# Patient Record
Sex: Male | Born: 1975 | Hispanic: Yes | Marital: Married | Smoking: Former smoker
Health system: Southern US, Community
[De-identification: ages and names within clinical notes are randomized; demographics above are authoritative.]

---

## 2020-03-05 ENCOUNTER — Other Ambulatory Visit: Payer: Self-pay

## 2020-03-05 ENCOUNTER — Encounter: Payer: Self-pay | Admitting: Emergency Medicine

## 2020-03-05 ENCOUNTER — Emergency Department: Payer: BLUE CROSS/BLUE SHIELD

## 2020-03-05 ENCOUNTER — Inpatient Hospital Stay
Admission: EM | Admit: 2020-03-05 | Discharge: 2020-03-07 | DRG: 177 | Disposition: A | Discharge status: Home / Self Care | Payer: BLUE CROSS/BLUE SHIELD | Attending: Family Medicine | Admitting: Family Medicine

## 2020-03-05 DIAGNOSIS — R7989 Other specified abnormal findings of blood chemistry: Secondary | ICD-10-CM | POA: Diagnosis present

## 2020-03-05 DIAGNOSIS — U071 COVID-19: Secondary | ICD-10-CM | POA: Diagnosis present

## 2020-03-05 DIAGNOSIS — J9601 Acute respiratory failure with hypoxia: Secondary | ICD-10-CM | POA: Diagnosis present

## 2020-03-05 DIAGNOSIS — J1282 Pneumonia due to coronavirus disease 2019: Secondary | ICD-10-CM | POA: Diagnosis present

## 2020-03-05 DIAGNOSIS — Z9981 Dependence on supplemental oxygen: Secondary | ICD-10-CM

## 2020-03-05 DIAGNOSIS — E669 Obesity, unspecified: Secondary | ICD-10-CM | POA: Diagnosis present

## 2020-03-05 DIAGNOSIS — Z6837 Body mass index (BMI) 37.0-37.9, adult: Secondary | ICD-10-CM

## 2020-03-05 DIAGNOSIS — R7982 Elevated C-reactive protein (CRP): Secondary | ICD-10-CM | POA: Diagnosis present

## 2020-03-05 DIAGNOSIS — E876 Hypokalemia: Secondary | ICD-10-CM | POA: Diagnosis present

## 2020-03-05 DIAGNOSIS — R7303 Prediabetes: Secondary | ICD-10-CM | POA: Diagnosis present

## 2020-03-05 DIAGNOSIS — Z87891 Personal history of nicotine dependence: Secondary | ICD-10-CM

## 2020-03-05 LAB — COMPREHENSIVE METABOLIC PANEL
ALT: 48 U/L — ABNORMAL HIGH (ref 0–44)
AST: 46 U/L — ABNORMAL HIGH (ref 15–41)
Albumin: 3.3 g/dL — ABNORMAL LOW (ref 3.5–5.0)
Alkaline Phosphatase: 41 U/L (ref 38–126)
Anion gap: 11 (ref 5–15)
BUN: 8 mg/dL (ref 6–20)
CO2: 26 mmol/L (ref 22–32)
Calcium: 8.7 mg/dL — ABNORMAL LOW (ref 8.9–10.3)
Chloride: 97 mmol/L — ABNORMAL LOW (ref 98–111)
Creatinine, Ser: 0.8 mg/dL (ref 0.61–1.24)
GFR calc Af Amer: >60 mL/min (ref >60)
GFR calc non Af Amer: >60 mL/min (ref >60)
Glucose, Bld: 118 mg/dL — ABNORMAL HIGH (ref 70–99)
Potassium: 3.2 mmol/L — ABNORMAL LOW (ref 3.5–5.1)
Sodium: 134 mmol/L — ABNORMAL LOW (ref 135–145)
Total Bilirubin: 0.9 mg/dL (ref 0.3–1.2)
Total Protein: 8.4 g/dL — ABNORMAL HIGH (ref 6.5–8.1)

## 2020-03-05 LAB — CBC WITH DIFFERENTIAL/PLATELET
Abs Immature Granulocytes: 0.06 10*3/uL (ref 0.00–0.07)
Basophils Absolute: 0 10*3/uL (ref 0.0–0.1)
Basophils Relative: 0 %
Eosinophils Absolute: 0.2 10*3/uL (ref 0.0–0.5)
Eosinophils Relative: 3 %
HCT: 39.9 % (ref 39.0–52.0)
Hemoglobin: 12.1 g/dL — ABNORMAL LOW (ref 13.0–17.0)
Immature Granulocytes: 1 %
Lymphocytes Relative: 20 %
Lymphs Abs: 1.5 10*3/uL (ref 0.7–4.0)
MCH: 25.2 pg — ABNORMAL LOW (ref 26.0–34.0)
MCHC: 30.3 g/dL (ref 30.0–36.0)
MCV: 83 fL (ref 80.0–100.0)
Monocytes Absolute: 0.3 10*3/uL (ref 0.1–1.0)
Monocytes Relative: 4 %
Neutro Abs: 5.4 10*3/uL (ref 1.7–7.7)
Neutrophils Relative %: 72 %
Platelets: 325 10*3/uL (ref 150–400)
RBC: 4.81 MIL/uL (ref 4.22–5.81)
RDW: 13.4 % (ref 11.5–15.5)
WBC Morphology: >10
WBC: 7.4 10*3/uL (ref 4.0–10.5)
nRBC: 0 % (ref 0.0–0.2)

## 2020-03-05 LAB — PROCALCITONIN: Procalcitonin: <0.1 ng/mL

## 2020-03-05 LAB — LACTIC ACID, PLASMA: Lactic Acid, Venous: 1.8 mmol/L (ref 0.5–1.9)

## 2020-03-05 LAB — TROPONIN I (HIGH SENSITIVITY): Troponin I (High Sensitivity): 6 ng/L (ref <18)

## 2020-03-05 LAB — BRAIN NATRIURETIC PEPTIDE: B Natriuretic Peptide: 25 pg/mL (ref 0.0–100.0)

## 2020-03-05 LAB — FERRITIN: Ferritin: 486 ng/mL — ABNORMAL HIGH (ref 24–336)

## 2020-03-05 LAB — FIBRINOGEN: Fibrinogen: 651 mg/dL — ABNORMAL HIGH (ref 210–475)

## 2020-03-05 LAB — C-REACTIVE PROTEIN: CRP: 7.3 mg/dL — ABNORMAL HIGH (ref <1.0)

## 2020-03-05 LAB — TRIGLYCERIDES: Triglycerides: 122 mg/dL (ref <150)

## 2020-03-05 LAB — FIBRIN DERIVATIVES D-DIMER (ARMC ONLY): Fibrin derivatives D-dimer (ARMC): 952.53 ng/mL (FEU) — ABNORMAL HIGH (ref 0.00–499.00)

## 2020-03-05 MED ORDER — DEXAMETHASONE SODIUM PHOSPHATE 10 MG/ML IJ SOLN
6.0000 mg | Freq: Once | INTRAMUSCULAR | Status: AC
  Administered 2020-03-05: 6 mg via INTRAVENOUS
  Filled 2020-03-05: qty 1

## 2020-03-05 MED ORDER — ACETAMINOPHEN 325 MG PO TABS: 650.0000 mg | ORAL_TABLET | Freq: Four times a day (QID) | ORAL | Status: DC | PRN

## 2020-03-05 MED ORDER — ENOXAPARIN SODIUM 60 MG/0.6ML ~~LOC~~ SOLN
60.0000 mg | Freq: Every day | SUBCUTANEOUS | Status: DC
  Filled 2020-03-05 (×3): qty 0.6

## 2020-03-05 MED ORDER — POTASSIUM CHLORIDE CRYS ER 20 MEQ PO TBCR
40.0000 meq | EXTENDED_RELEASE_TABLET | Freq: Once | ORAL | Status: AC
  Administered 2020-03-05: 40 meq via ORAL
  Filled 2020-03-05: qty 2

## 2020-03-05 MED ORDER — GUAIFENESIN-DM 100-10 MG/5ML PO SYRP: 10.0000 mL | ORAL_SOLUTION | ORAL | Status: DC | PRN

## 2020-03-05 MED ORDER — ONDANSETRON HCL 4 MG PO TABS: 4.0000 mg | ORAL_TABLET | Freq: Four times a day (QID) | ORAL | Status: DC | PRN

## 2020-03-05 MED ORDER — ONDANSETRON HCL 4 MG/2ML IJ SOLN: 4.0000 mg | Freq: Four times a day (QID) | INTRAMUSCULAR | Status: DC | PRN

## 2020-03-05 MED ORDER — ASCORBIC ACID 500 MG PO TABS
500.0000 mg | ORAL_TABLET | Freq: Every day | ORAL | Status: DC
  Administered 2020-03-06 – 2020-03-07 (×2): 500 mg via ORAL
  Filled 2020-03-05 (×2): qty 1

## 2020-03-05 MED ORDER — ZINC SULFATE 220 (50 ZN) MG PO CAPS
220.0000 mg | ORAL_CAPSULE | Freq: Every day | ORAL | Status: DC
  Administered 2020-03-06 – 2020-03-07 (×2): 220 mg via ORAL
  Filled 2020-03-05 (×2): qty 1

## 2020-03-05 MED ORDER — HYDROCOD POLST-CPM POLST ER 10-8 MG/5ML PO SUER: 5.0000 mL | Freq: Two times a day (BID) | ORAL | Status: DC | PRN

## 2020-03-05 MED ORDER — SODIUM CHLORIDE 0.9 % IV SOLN
100.0000 mg | Freq: Every day | INTRAVENOUS | Status: DC
  Administered 2020-03-06 – 2020-03-07 (×2): 100 mg via INTRAVENOUS
  Filled 2020-03-05: qty 20
  Filled 2020-03-05: qty 100

## 2020-03-05 MED ORDER — SODIUM CHLORIDE 0.9 % IV SOLN
200.0000 mg | Freq: Once | INTRAVENOUS | Status: AC
  Administered 2020-03-05: 200 mg via INTRAVENOUS
  Filled 2020-03-05: qty 40

## 2020-03-05 MED ORDER — SODIUM CHLORIDE 0.9% FLUSH
3.0000 mL | Freq: Two times a day (BID) | INTRAVENOUS | Status: DC
  Administered 2020-03-05 – 2020-03-07 (×4): 3 mL via INTRAVENOUS

## 2020-03-05 MED ORDER — DEXAMETHASONE 4 MG PO TABS
6.0000 mg | ORAL_TABLET | Freq: Every day | ORAL | Status: DC
  Administered 2020-03-06: 6 mg via ORAL
  Filled 2020-03-05: qty 2

## 2020-03-05 NOTE — ED Triage Notes (Signed)
FIRST NURSE NOTE- brought from [facility] for [facility] r/t covid.  sats 93 at [facility] on room air.

## 2020-03-05 NOTE — ED Notes (Signed)
Requested copy of pt's covid test results from [facility] at [facility] urgent care

## 2020-03-05 NOTE — ED Notes (Signed)
Pt placed on 2L [place] due to O2 at 90% on RA

## 2020-03-05 NOTE — ED Notes (Signed)
Received copy of pt's positive test result from [facility] urgent care

## 2020-03-05 NOTE — ED Notes (Signed)
Transport here to take patient to OR for procedure

## 2020-03-05 NOTE — ED Notes (Signed)
Pt speaking with this RN in NAD, speaking in complete sentences without SHOB.

## 2020-03-05 NOTE — ED Notes (Signed)
Sandwich tray and snacks provided per pt request

## 2020-03-05 NOTE — Consult Note (Signed)
Remdesivir - Pharmacy Brief Note   O:  ALT: pending CXR: suspicious for atypical organism pneumonia SpO2: 93% on room air   A/P:  Per chart, patient tested positive for SARS-CoV-2 on 3/22   Remdesivir 200 mg IVPB once followed by 100 mg IVPB daily x 4 days.   Dorothea Ogle Pharmacy Resident 03/05/2020 2:37 PM

## 2020-03-05 NOTE — H&P (Signed)
History and Physical   Lyncoln Ledgerwood BMW:[id number] DOB: March 04, 1976 DOA: 03/05/2020  Referring MD/NP/PA: Dr. Cherylann Banas, [place] PCP: None [Health Department] Outpatient Specialists: None Patient coming from: Home  Chief Complaint: Covid, cough, dyspnea  HPI: Alexander Newman is a 44 y.o. male with no medical follow up with no known chronic medical conditions who presented to [facility] ED 4/2 due to cough and shortness of breath having tested positive for covid-19 on 3/23. Symptoms began gradually with nonproductive cough with mild associated exertional dyspnea on 3/20. This stayed approximately stable, constant for the next week up until the previous couple days.when dyspnea has worsened, present at rest. He's had mild fatigue but no other significant symptoms. His wife also has a cough and tested positive for covid.    ED Course: Temp 33F, RR 39, 90% at rest on room air with significant shortness of breath. WBC, lactic acid, troponin, BNP, and PCT all within normal limits. ALT 48. D-dimer 952 (ULN 500). CXR demonstrates significant bilateral patchy infiltrates.   Review of Systems: Denies fever, chills, weight loss, changes in vision or hearing, headache, sore throat, chest pain, palpitations, abdominal pain, nausea, vomiting, changes in bowel habits, blood in stool, change in bladder habits, myalgias, arthralgias, and rash, and per HPI. All others reviewed and are negative.   PMH: History reviewed. No pertinent past medical history.  PSH: History reviewed. No pertinent surgical history. SH: Full time truck driver, married, lives with wife. He reports that he has quit smoking. He has never used smokeless tobacco. No history on file for alcohol and drug.  Meds: None Allergies: No Known Allergies FH: No FH or personal hx DVT.   Physical Exam: Vitals:   03/05/20 1341 03/05/20 1600 03/05/20 1700 03/05/20 1730  BP:  116/64 (!) 118/57 114/68  Pulse:  82 81 84  Resp:  (!) 39 (!) 32 (!) 25  Temp:       SpO2:  91% 98% 93%  Weight: 127 kg     Height: 6' (1.829 m)      Constitutional: 44 y.o. male in no distress, calm demeanor Eyes: Lids and conjunctivae normal, PERRL ENMT: Mucous membranes are moist. Posterior pharynx clear of any exudate or lesions. Normal dentition.  Neck: Supple, no masses, no thyromegaly Respiratory: Tachypneic 30-40's without accessory muscle use. Crackles diffusely bilaterally Cardiovascular: Regular borderline tachycardia, no murmurs, rubs, or gallops. No carotid bruits. No JVD. No LE edema. Palpable pedal pulses. Abdomen: Normoactive bowel sounds. No tenderness, non-distended, and no masses palpated. No hepatosplenomegaly. GU: No indwelling catheter Musculoskeletal: No clubbing / cyanosis. No joint deformity upper and lower extremities. Good ROM, no contractures. Normal muscle tone.  Skin: Warm, dry. No rashes, wounds, or ulcers. No significant lesions noted.  Neurologic: CN II-XII grossly intact. Speech normal. No focal deficits in motor strength or sensation in all extremities.  Psychiatric: Alert and oriented x3. Normal judgment and insight. Mood euthymic with congruent affect.   Labs on Admission: I have personally reviewed following labs and imaging studies  CBC: Recent Labs  Lab 03/05/20 1459  WBC 7.4  NEUTROABS 5.4  HGB 12.1*  HCT 39.9  MCV 83.0  PLT 272   Basic Metabolic Panel: Recent Labs  Lab 03/05/20 1459  NA 134*  K 3.2*  CL 97*  CO2 26  GLUCOSE 118*  BUN 8  CREATININE 0.80  CALCIUM 8.7*   Liver Function Tests: Recent Labs  Lab 03/05/20 1459  AST 46*  ALT 48*  ALKPHOS 41  BILITOT 0.9  PROT  8.4*  ALBUMIN 3.3*   Lipid Profile: Recent Labs    03/05/20 1500  TRIG 122   Anemia Panel: Recent Labs    03/05/20 1459  FERRITIN 486*   Radiological Exams on Admission: DG Chest Portable 1 View  Result Date: 03/05/2020 CLINICAL DATA:  COVID-19 positive with cough and shortness of breath EXAM: PORTABLE CHEST 1 VIEW  COMPARISON:  None. FINDINGS: There is widespread interstitial and patchy airspace opacity throughout the lungs bilaterally. Heart is upper normal in size with pulmonary vascularity normal. No adenopathy. No bone lesions. IMPRESSION: Widespread interstitial and patchy airspace opacity consistent with multifocal pneumonia. Suspect atypical organism pneumonia given this appearance and clinical history. Heart upper normal in size. No adenopathy evident. Electronically Signed   By: Bretta Bang III M.D.   On: 03/05/2020 14:13   EKG: Independently reviewed. NSR with normal axis and intervals, no ST segment deviation. Inferior Q waves noted.   Assessment/Plan Principal Problem:   Pneumonia due to COVID-19 virus Active Problems:   Obesity (BMI 35.0-39.9 without comorbidity)   LFT elevation   Hypokalemia   Acute hypoxemic respiratory failure due to covid-19 pneumonia: SARS-CoV-2 PCR positive on 3/23. I have received proof of this positive result in email from wife. CXR with significant infiltrates, CRP pending.  - Start remdesivir x5 days (4/2 - 4/6) - Steroids x10 days - Discussed potential use of tocilizumab in off label capacity, he has no exclusion criteria to this. Would only be indicated if hypoxemia progresses. - Vitamin C, zinc - Encourage OOB, IS, FV, and awake proning if able - Tylenol and antitussives prn - Continue airborne, contact precautions for 21 days from positive testing. - Use weight-based lovenox dose, d-dimer < 2x ULN.  - Maintain euvolemia/net negative.  - Avoid NSAIDs   Obesity: Body mass index is 37.97 kg/m.  - Will check HbA1c in AM  Hypokalemia: Mild.  - Supplement and recheck with Mg.  LFT elevation: Fits in constellation of findings of covid. Not a contraindication to therapies as above. No cholestatic pattern. - Will monitor with remdesivir therapy.   DVT prophylaxis: Lovenox 0.5mg /kg q24h  Code Status: Full  Family Communication: Wife by  phone Disposition Plan: Return home pending clinical course.  Consults called: None  Admission status: Inpatient    Tyrone Nine, MD [facility] [email or web address] 03/05/2020, 5:35 PM

## 2020-03-05 NOTE — ED Provider Notes (Signed)
[facility] Emergency Department Provider Note ____________________________________________   First MD Initiated Contact with Patient 03/05/20 1348     (approximate)  I have reviewed the triage vital signs and the nursing notes.   HISTORY  Chief Complaint Cough    HPI [name] is a 44 y.o. male with no significant PMH who presents with cough over the last 2 to 3 days, gradual onset, persistent course, and associated with increased shortness of breath especially with exertion.  The patient reports some generalized weakness but denies fever, vomiting, diarrhea, or any specific pain.  He was diagnosed with COVID-19 on 3/22 and states he was feeling fine until the last 3 days.  He was at an appointment to be cleared back to work when he was noted to be coughing a lot and short of breath, and was sent to [place] clinic, and then from there to the ED.  History reviewed. No pertinent past medical history.  Patient Active Problem List   Diagnosis Date Noted  . Pneumonia due to COVID-19 virus 03/05/2020    History reviewed. No pertinent surgical history.  Prior to Admission medications   Not on File    Allergies Patient has no known allergies.  History reviewed. No pertinent family history.  Social History Social History   Tobacco Use  . Smoking status: Former Games developer  . Smokeless tobacco: Never Used  Substance Use Topics  . Alcohol use: Not on file  . Drug use: Not on file    Review of Systems  Constitutional: No fever. Eyes: No redness. ENT: No sore throat. Cardiovascular: Denies chest pain. Respiratory: Positive for shortness of breath. Gastrointestinal: No vomiting or diarrhea.  Genitourinary: Negative for flank pain. Musculoskeletal: Negative for back pain. Skin: Negative for rash. Neurological: Negative for headache.   ____________________________________________   PHYSICAL EXAM:  VITAL SIGNS: ED Triage Vitals  Enc  Vitals Group     BP 03/05/20 1340 119/64     Pulse Rate 03/05/20 1340 90     Resp 03/05/20 1340 (!) 30     Temp 03/05/20 1340 99 F (37.2 C)     Temp src --      SpO2 03/05/20 1340 93 %     Weight 03/05/20 1341 280 lb (127 kg)     Height 03/05/20 1341 6' (1.829 m)     Head Circumference --      Peak Flow --      Pain Score 03/05/20 1340 0     Pain Loc --      Pain Edu? --      Excl. in GC? --     Constitutional: Alert and oriented.  Somewhat weak appearing but in no acute distress. Eyes: Conjunctivae are normal.  Head: Atraumatic. Nose: No congestion/rhinnorhea. Mouth/Throat: Mucous membranes are moist.   Neck: Normal range of motion.  Cardiovascular: Normal rate, regular rhythm. Grossly normal heart sounds.  Good peripheral circulation. Respiratory: Increased respiratory effort.  No retractions. Lungs CTAB. Gastrointestinal: No distention.  Musculoskeletal: Extremities warm and well perfused.  Neurologic:  Normal speech and language. No gross focal neurologic deficits are appreciated.  Skin:  Skin is warm and dry. No rash noted. Psychiatric: Mood and affect are normal. Speech and behavior are normal.  ____________________________________________   LABS (all labs ordered are listed, but only abnormal results are displayed)  Labs Reviewed  CBC WITH DIFFERENTIAL/PLATELET - Abnormal; Notable for the following components:      Result Value   Hemoglobin 12.1 (*)  MCH 25.2 (*)    All other components within normal limits  COMPREHENSIVE METABOLIC PANEL - Abnormal; Notable for the following components:   Sodium 134 (*)    Potassium 3.2 (*)    Chloride 97 (*)    Glucose, Bld 118 (*)    Calcium 8.7 (*)    Total Protein 8.4 (*)    Albumin 3.3 (*)    AST 46 (*)    ALT 48 (*)    All other components within normal limits  FIBRIN DERIVATIVES D-DIMER ([facility] ONLY) - Abnormal; Notable for the following components:   Fibrin derivatives D-dimer ([facility]) 952.53 (*)    All other  components within normal limits  FERRITIN - Abnormal; Notable for the following components:   Ferritin 486 (*)    All other components within normal limits  FIBRINOGEN - Abnormal; Notable for the following components:   Fibrinogen 651 (*)    All other components within normal limits  LACTIC ACID, PLASMA  PROCALCITONIN  TRIGLYCERIDES  BRAIN NATRIURETIC PEPTIDE  C-REACTIVE PROTEIN  TROPONIN I (HIGH SENSITIVITY)   ____________________________________________  EKG  ED ECG REPORT I, Arta Silence, the attending physician, personally viewed and interpreted this ECG.  Date: 03/05/2020 EKG Time: 1501 Rate: 92 Rhythm: normal sinus rhythm QRS Axis: normal Intervals: normal ST/T Wave abnormalities: normal Narrative Interpretation: Q waves in the inferior leads with no evidence of acute ischemia  ____________________________________________  RADIOLOGY  CXR: Bilateral interstitial opacity  ____________________________________________   PROCEDURES  Procedure(s) performed: No  Procedures  Critical Care performed: No ____________________________________________   INITIAL IMPRESSION / ASSESSMENT AND PLAN / ED COURSE  Pertinent labs & imaging results that were available during my care of the patient were reviewed by me and considered in my medical decision making (see chart for details).  44 year old male with no significant PMH presents with increased cough, shortness of breath, weakness over approximately last 3 days after being diagnosed with COVID-19 10 days ago.  In [place] clinic today, he was noted to have an O2 saturation around 93% with increased work of breathing.  On initial exam, the patient appeared relatively comfortable although did have some tachypnea; on reassessment, he appeared more tachypneic with increased work of breathing and an O2 saturation ranging between 90 and 93% on room air.  The remainder of the exam is as described above.  Overall  presentation is consistent with symptoms related to COVID-19.  I initially obtain an x-ray to restratify, and it shows extensive bilateral infiltrates.  Given the patient's hypoxia, increased work of breathing, and extensive infiltrates on x-ray he will benefit from admission.  We will place him on O2 by nasal cannula, give dexamethasone and remdesivir, and plan for admission.  ----------------------------------------- 5:07 PM on 03/05/2020 -----------------------------------------  Lab workup is consistent with covid.  The patient remains stable on 2L O2 by nasal cannula.  I discussed his case with Dr. Bonner Puna for admission.   _________________________  Alexander Newman was evaluated in Emergency Department on 03/05/2020 for the symptoms described in the history of present illness. He was evaluated in the context of the global COVID-19 pandemic, which necessitated consideration that the patient might be at risk for infection with the SARS-CoV-2 virus that causes COVID-19. Institutional protocols and algorithms that pertain to the evaluation of patients at risk for COVID-19 are in a state of rapid change based on information released by regulatory bodies including the CDC and federal and state organizations. These policies and algorithms were followed during the patient's care in  the ED. ____________________________________________   FINAL CLINICAL IMPRESSION(S) / ED DIAGNOSES  Final diagnoses:  Acute respiratory failure with hypoxia (HCC)  COVID-19      NEW MEDICATIONS STARTED DURING THIS VISIT:  New Prescriptions   No medications on file     Note:  This document was prepared using Dragon voice recognition software and may include unintentional dictation errors.   Dionne Bucy, MD 03/05/20 [phone]

## 2020-03-05 NOTE — ED Triage Notes (Signed)
Pt to triage states he has had cough for last 2 days.  Pt noted to have increased respiratory effort.  Pt states tested pos for Covid on 3/22.  States Health Department nurse cleared him today, if he agreed to come have the cough evaluated.  Pt states until the last 2 days he has been asymptomatic. States entire family has tested pos.

## 2020-03-06 LAB — COMPREHENSIVE METABOLIC PANEL
ALT: 43 U/L (ref 0–44)
AST: 38 U/L (ref 15–41)
Albumin: 3 g/dL — ABNORMAL LOW (ref 3.5–5.0)
Alkaline Phosphatase: 44 U/L (ref 38–126)
Anion gap: 11 (ref 5–15)
BUN: 10 mg/dL (ref 6–20)
CO2: 23 mmol/L (ref 22–32)
Calcium: 8.5 mg/dL — ABNORMAL LOW (ref 8.9–10.3)
Chloride: 102 mmol/L (ref 98–111)
Creatinine, Ser: 0.61 mg/dL (ref 0.61–1.24)
GFR calc Af Amer: >60 mL/min (ref >60)
GFR calc non Af Amer: >60 mL/min (ref >60)
Glucose, Bld: 120 mg/dL — ABNORMAL HIGH (ref 70–99)
Potassium: 3.6 mmol/L (ref 3.5–5.1)
Sodium: 136 mmol/L (ref 135–145)
Total Bilirubin: 0.6 mg/dL (ref 0.3–1.2)
Total Protein: 7.9 g/dL (ref 6.5–8.1)

## 2020-03-06 LAB — FIBRIN DERIVATIVES D-DIMER (ARMC ONLY): Fibrin derivatives D-dimer (ARMC): 746.18 ng/mL (FEU) — ABNORMAL HIGH (ref 0.00–499.00)

## 2020-03-06 LAB — MAGNESIUM: Magnesium: 2.5 mg/dL — ABNORMAL HIGH (ref 1.7–2.4)

## 2020-03-06 LAB — HIV ANTIBODY (ROUTINE TESTING W REFLEX): HIV Screen 4th Generation wRfx: NONREACTIVE

## 2020-03-06 LAB — ABO/RH: ABO/RH(D): O POS

## 2020-03-06 LAB — HEMOGLOBIN A1C
Hgb A1c MFr Bld: 6.4 % — ABNORMAL HIGH (ref 4.8–5.6)
Mean Plasma Glucose: 136.98 mg/dL

## 2020-03-06 LAB — C-REACTIVE PROTEIN: CRP: 7.3 mg/dL — ABNORMAL HIGH (ref <1.0)

## 2020-03-06 NOTE — Progress Notes (Signed)
91% on room air with exertion

## 2020-03-06 NOTE — Progress Notes (Signed)
PROGRESS NOTE  Alexander Newman  GMW:[id number] DOB: Nov 22, 1976 DOA: 03/05/2020 PCP: None Brief Narrative: Alexander Newman is a 44 y.o. male with no medical follow up with no known chronic medical conditions who presented to [facility] ED 4/2 due to cough and shortness of breath having tested positive for covid-19 on 3/23. Temp 54F, RR 39/min, 90% at rest on room air with significant shortness of breath. WBC, lactic acid, troponin, BNP, and PCT all within normal limits. CRP 7.3. ALT 48. D-dimer 952 (ULN 500). CXR demonstrated significant bilateral patchy infiltrates. Remdesivir and decadron were started and the patient was admitted on 2L O2.   Assessment & Plan: Principal Problem:   Pneumonia due to COVID-19 virus Active Problems:   Obesity (BMI 35.0-39.9 without comorbidity)   LFT elevation   Hypokalemia  Acute hypoxemic respiratory failure due to covid-19 pneumonia: SARS-CoV-2 PCR positive on 3/23.CXR with significant infiltrates, CRP elevated. Prediabetes and obesity place this patient at high risk of clinical deterioration.  - Continue remdesivir x5 days (4/2 - 4/6) - Steroids x10 days - Discussed potential use of tocilizumab in off label capacity, he has no exclusion criteria to this. Would only be indicated if hypoxemia progresses. - Vitamin C, zinc - Encourage OOB, IS, FV, and awake proning if able - Tylenol and antitussives prn - Continue airborne, contact precautions for 21 days from positive testing. - Use weight-based lovenox dose, d-dimer declining. - Maintain euvolemia/net negative.  - Avoid NSAIDs  Obesity: Body mass index is 37.97 kg/m.   Hypokalemia: Mild, resolved.   LFT elevation: Fits in constellation of findings of covid. Not a contraindication to therapies as above. No cholestatic pattern. Resolved.  - Will monitor with remdesivir therapy.   Prediabetes: HbA1c 6.4%.  - Needs to establish care as an outpatient with consideration of metformin initiation.  DVT  prophylaxis: Lovenox Code Status: Full Family Communication: None at bedside Disposition Plan: Patient likely to return home once respiratory effort decreases and hypoxemia resolved. Inflammatory marker has not budged and he remains on 2L O2. Possible DC if clinically improves 4/4-4/5  Consultants:   None  Procedures:   None  Antimicrobials:  Remdesivir   Subjective: Breathing a bit better than admission, still short of breath with exertion, improved with supplemental oxygen.  Objective: Vitals:   03/05/20 2200 03/05/20 2303 03/05/20 2335 03/06/20 0800  BP: 113/67 122/72    Pulse: 77 75    Resp: (!) 32 18 (!) 22 20  Temp:  98 F (36.7 C)    TempSrc:   Oral   SpO2: 90% 95%    Weight:   130.5 kg   Height:   6\' 1"  (1.854 m)    No intake or output data in the 24 hours ending 03/06/20 1313 Filed Weights   03/05/20 1341 03/05/20 2335  Weight: 127 kg 130.5 kg    Gen: 44 y.o. male in no distress Pulm: Non-labored breathing 2L O2, rate still ~25/min. Clearing crackles bilaterally.  CV: Regular rate and rhythm. No murmur, rub, or gallop. No JVD, no pedal edema. GI: Abdomen soft, non-tender, non-distended, with normoactive bowel sounds. No organomegaly or masses felt. Ext: Warm, no deformities Skin: No rashes, lesions or ulcers Neuro: Alert and oriented. No focal neurological deficits. Psych: Judgement and insight appear normal. Mood & affect appropriate.   Data Reviewed: I have personally reviewed following labs and imaging studies  CBC: Recent Labs  Lab 03/05/20 1459  WBC 7.4  NEUTROABS 5.4  HGB 12.1*  HCT 39.9  MCV 83.0  PLT 607   Basic Metabolic Panel: Recent Labs  Lab 03/05/20 1459 03/06/20 0414  NA 134* 136  K 3.2* 3.6  CL 97* 102  CO2 26 23  GLUCOSE 118* 120*  BUN 8 10  CREATININE 0.80 0.61  CALCIUM 8.7* 8.5*  MG  --  2.5*   GFR: Estimated Creatinine Clearance: 172 mL/min (by C-G formula based on SCr of 0.61 mg/dL). Liver Function  Tests: Recent Labs  Lab 03/05/20 1459 03/06/20 0414  AST 46* 38  ALT 48* 43  ALKPHOS 41 44  BILITOT 0.9 0.6  PROT 8.4* 7.9  ALBUMIN 3.3* 3.0*   No results for input(s): LIPASE, AMYLASE in the last 168 hours. No results for input(s): AMMONIA in the last 168 hours. Coagulation Profile: No results for input(s): INR, PROTIME in the last 168 hours. Cardiac Enzymes: No results for input(s): CKTOTAL, CKMB, CKMBINDEX, TROPONINI in the last 168 hours. BNP (last 3 results) No results for input(s): PROBNP in the last 8760 hours. HbA1C: Recent Labs    03/06/20 0414  HGBA1C 6.4*   CBG: No results for input(s): GLUCAP in the last 168 hours. Lipid Profile: Recent Labs    03/05/20 1500  TRIG 122   Thyroid Function Tests: No results for input(s): TSH, T4TOTAL, FREET4, T3FREE, THYROIDAB in the last 72 hours. Anemia Panel: Recent Labs    03/05/20 1459  FERRITIN 486*   Urine analysis: No results found for: COLORURINE, APPEARANCEUR, LABSPEC, PHURINE, GLUCOSEU, HGBUR, BILIRUBINUR, KETONESUR, PROTEINUR, UROBILINOGEN, NITRITE, LEUKOCYTESUR No results found for this or any previous visit (from the past 240 hour(s)).    Radiology Studies: DG Chest Portable 1 View  Result Date: 03/05/2020 CLINICAL DATA:  COVID-19 positive with cough and shortness of breath EXAM: PORTABLE CHEST 1 VIEW COMPARISON:  None. FINDINGS: There is widespread interstitial and patchy airspace opacity throughout the lungs bilaterally. Heart is upper normal in size with pulmonary vascularity normal. No adenopathy. No bone lesions. IMPRESSION: Widespread interstitial and patchy airspace opacity consistent with multifocal pneumonia. Suspect atypical organism pneumonia given this appearance and clinical history. Heart upper normal in size. No adenopathy evident. Electronically Signed   By: Lowella Grip III M.D.   On: 03/05/2020 14:13    Scheduled Meds: . vitamin C  500 mg Oral Daily  . dexamethasone  6 mg Oral q1800   . enoxaparin (LOVENOX) injection  60 mg Subcutaneous q1800  . sodium chloride flush  3 mL Intravenous Q12H  . zinc sulfate  220 mg Oral Daily   Continuous Infusions: . remdesivir 100 mg in NS 100 mL 100 mg (03/06/20 1009)     LOS: 1 day   Time spent: 25 minutes.  Patrecia Pour, MD [facility] [email or web address] 03/06/2020, 1:13 PM

## 2020-03-07 DIAGNOSIS — U071 COVID-19: Principal | ICD-10-CM

## 2020-03-07 DIAGNOSIS — J1282 Pneumonia due to Coronavirus disease 2019: Secondary | ICD-10-CM

## 2020-03-07 DIAGNOSIS — E669 Obesity, unspecified: Secondary | ICD-10-CM

## 2020-03-07 DIAGNOSIS — R7989 Other specified abnormal findings of blood chemistry: Secondary | ICD-10-CM

## 2020-03-07 DIAGNOSIS — E876 Hypokalemia: Secondary | ICD-10-CM

## 2020-03-07 LAB — COMPREHENSIVE METABOLIC PANEL
ALT: 48 U/L — ABNORMAL HIGH (ref 0–44)
AST: 39 U/L (ref 15–41)
Albumin: 3.1 g/dL — ABNORMAL LOW (ref 3.5–5.0)
Alkaline Phosphatase: 41 U/L (ref 38–126)
Anion gap: 8 (ref 5–15)
BUN: 12 mg/dL (ref 6–20)
CO2: 25 mmol/L (ref 22–32)
Calcium: 8.6 mg/dL — ABNORMAL LOW (ref 8.9–10.3)
Chloride: 105 mmol/L (ref 98–111)
Creatinine, Ser: 0.65 mg/dL (ref 0.61–1.24)
GFR calc Af Amer: >60 mL/min (ref >60)
GFR calc non Af Amer: >60 mL/min (ref >60)
Glucose, Bld: 106 mg/dL — ABNORMAL HIGH (ref 70–99)
Potassium: 4.1 mmol/L (ref 3.5–5.1)
Sodium: 138 mmol/L (ref 135–145)
Total Bilirubin: 0.7 mg/dL (ref 0.3–1.2)
Total Protein: 7.8 g/dL (ref 6.5–8.1)

## 2020-03-07 LAB — FIBRIN DERIVATIVES D-DIMER (ARMC ONLY): Fibrin derivatives D-dimer (ARMC): 663.47 ng/mL (FEU) — ABNORMAL HIGH (ref 0.00–499.00)

## 2020-03-07 LAB — MAGNESIUM: Magnesium: 2.4 mg/dL (ref 1.7–2.4)

## 2020-03-07 LAB — C-REACTIVE PROTEIN: CRP: 2.7 mg/dL — ABNORMAL HIGH (ref <1.0)

## 2020-03-07 MED ORDER — DEXAMETHASONE 6 MG PO TABS: 6.0000 mg | ORAL_TABLET | Freq: Every day | ORAL | 0 refills | Status: AC

## 2020-03-07 MED ORDER — SODIUM CHLORIDE 0.9 % IV SOLN
100.0000 mg | Freq: Once | INTRAVENOUS | Status: AC
  Administered 2020-03-08: 100 mg via INTRAVENOUS
  Filled 2020-03-07: qty 100

## 2020-03-07 NOTE — Discharge Instructions (Addendum)
You are scheduled for an outpatient infusion of Remdesivir at 11:30 AM on Monday 4/5 and Tuesday 4/6   Please report to [facility] at [address]  Drive to the security guard and tell them you are here for an infusion. They will direct you to the front entrance where we will come and get you.  For questions call [phone].  Thanks

## 2020-03-07 NOTE — Plan of Care (Signed)
All care plans resolved. Patient discharged to home

## 2020-03-07 NOTE — Progress Notes (Signed)
Patient scheduled for outpatient Remdesivir infusion at 11:30 AM on Monday 4/5 and Tuesday 4/6 .  Please advise them to report to [facility] at [address]  Drive to the security guard and tell them you are here for an infusion. They will direct you to the front entrance where we will come and get you.  For questions call [phone].  Thanks

## 2020-03-07 NOTE — Discharge Summary (Signed)
Physician Discharge Summary  Elic Vencill URK:[id number] DOB: 1976-06-22 DOA: 03/05/2020  PCP: Patient, No Pcp Per  Admit date: 03/05/2020 Discharge date: 03/07/2020  Admitted From: Home Disposition: Home   Recommendations for Outpatient Follow-up:  1. Follow up with PCP for healthcare maintenance.  Home Health: None Equipment/Devices: None Discharge Condition: Stable CODE STATUS: Full Diet recommendation: Heart healthy  Brief/Interim Summary: Byrl Latin a 44 y.o.malewithno medical follow up with no known chronic medical conditions who presented to [facility] ED 4/2 due to cough and shortness of breath having tested positive for covid-19 on 3/23. Temp 68F, RR 39/min, 90% at rest on room air with significant shortness of breath. WBC, lactic acid, troponin, BNP, and PCT all within normal limits. CRP 7.3. ALT 48. D-dimer 952 (ULN 500). CXR demonstrated significant bilateral patchy infiltrates.Remdesivir and decadron were started and the patient was admitted on 2L O2. With treatment, hypoxemia resolved and he is discharged in stable condition with plans to complete treatment as outpatient.  Discharge Diagnoses:  Principal Problem:   Pneumonia due to COVID-19 virus Active Problems:   Obesity (BMI 35.0-39.9 without comorbidity)   LFT elevation   Hypokalemia  Acute hypoxemic respiratory failure due to covid-19 pneumonia: SARS-CoV-2 PCR positive on3/23.CXR with significant infiltrates, CRP elevated. Prediabetes and obesity place this patient at high risk of clinical deterioration.  -Continueremdesivir x5 days (4/2 - 4/6). Will follow up for 4th and 5th doses as outpatient (scheduled prior to discharge) - Steroids x5 days  - Continue airborne, contact precautions for 21 days from positive testing.  Obesity:Body mass index is 37.97 kg/m.  Hypokalemia: Mild, resolved.   LFT elevation: Fits in constellation of findings of covid. Not a contraindication to therapies as above. No  cholestatic pattern. Resolved.  Prediabetes: HbA1c 6.4%.  - Needs to establish care as an outpatient with consideration of metformin initiation.  Discharge Instructions Discharge Instructions    Diet Carb Modified   Complete by: As directed    Discharge instructions   Complete by: As directed    Continue decadron for 2 more days Continue remdesivir (go to [facility] for infusions) for 2 more days Seek medical advice sooner if your symptoms return/worsen Remain in isolation for a total of 21 days from the date of your positive test (02/24/2020). You may end isolation on 03/16/2020.  It is important to establish care with a PCP. Your testing indicates you are prediabetic and would benefit from dietary modifications and starting a medication.   MyChart COVID-19 home monitoring program   Complete by: Mar 07, 2020    Is the patient willing to use the [place] for home monitoring?: Yes     Allergies as of 03/07/2020   No Known Allergies     Medication List    TAKE these medications   dexamethasone 6 MG tablet Commonly known as: Decadron Take 1 tablet (6 mg total) by mouth daily for 2 days. Start taking on: March 08, 2020      Follow-up Information    [facility].   Contact information: [place]       [place]. Go on 03/08/2020.   Why: Monday and Tuesday at 11:30am Contact information: [place] [address] [phone]         No Known Allergies  Consultations:  None  Procedures/Studies: DG Chest Portable 1 View  Result Date: 03/05/2020 CLINICAL DATA:  COVID-19 positive with cough and shortness of breath EXAM: PORTABLE CHEST 1 VIEW COMPARISON:  None. FINDINGS: There is widespread interstitial and patchy airspace opacity throughout the lungs bilaterally. Heart is upper normal in size with pulmonary  vascularity normal. No adenopathy. No bone lesions. IMPRESSION: Widespread interstitial and patchy airspace opacity consistent with multifocal pneumonia. Suspect atypical organism pneumonia given this appearance and clinical history. Heart upper normal in size. No adenopathy evident. Electronically Signed   By: Bretta Bang III M.D.   On: 03/05/2020 14:13     Subjective: Feels well, ambulated with minimal dyspnea and maintain SpO2 >90% on room air. Dyspnea has improved. Wants to go home ASAP.  Discharge Exam: Vitals:   03/06/20 2334 03/07/20 0735  BP: 116/71 115/69  Pulse: 75 74  Resp: 16 17  Temp: 98.5 F (36.9 C) 97.8 F (36.6 C)  SpO2: 94% 94%   General: Pt is alert, awake, not in acute distress Cardiovascular: RRR, S1/S2 +, no rubs, no gallops Respiratory: CTA bilaterally, no wheezing, no rhonchi Abdominal: Soft, NT, ND, bowel sounds + Extremities: No edema, no cyanosis  Labs: BNP (last 3 results) Recent Labs    03/05/20 1500  BNP 25.0   Basic Metabolic Panel: Recent Labs  Lab 03/05/20 1459 03/06/20 0414 03/07/20 0555  NA 134* 136 138  K 3.2* 3.6 4.1  CL 97* 102 105  CO2 26 23 25   GLUCOSE 118* 120* 106*  BUN 8 10 12   CREATININE 0.80 0.61 0.65  CALCIUM 8.7* 8.5* 8.6*  MG  --  2.5* 2.4   Liver Function Tests: Recent Labs  Lab 03/05/20 1459 03/06/20 0414 03/07/20 0555  AST 46* 38 39  ALT 48* 43 48*  ALKPHOS 41 44 41  BILITOT 0.9 0.6 0.7  PROT 8.4* 7.9 7.8  ALBUMIN 3.3* 3.0* 3.1*   No results for input(s): LIPASE, AMYLASE in the last 168 hours. No results for input(s): AMMONIA in the last 168 hours. CBC: Recent Labs  Lab 03/05/20 1459  WBC 7.4  NEUTROABS 5.4  HGB 12.1*  HCT 39.9  MCV 83.0  PLT 325   Cardiac Enzymes: No results for input(s): CKTOTAL, CKMB, CKMBINDEX, TROPONINI in the last 168 hours. BNP: Invalid input(s): POCBNP CBG: No results for input(s): GLUCAP in the last 168 hours. D-Dimer No results for input(s): DDIMER in the  last 72 hours. Hgb A1c Recent Labs    03/06/20 0414  HGBA1C 6.4*   Lipid Profile Recent Labs    03/05/20 1500  TRIG 122   Thyroid function studies No results for input(s): TSH, T4TOTAL, T3FREE, THYROIDAB in the last 72 hours.  Invalid input(s): FREET3 Anemia work up Recent Labs    03/05/20 1459  FERRITIN 486*   Urinalysis No results found for: COLORURINE, APPEARANCEUR, LABSPEC, PHURINE, GLUCOSEU, HGBUR, BILIRUBINUR, KETONESUR, PROTEINUR, UROBILINOGEN, NITRITE, LEUKOCYTESUR  Microbiology No results found for this or any previous visit (from the past 240 hour(s)).  Time coordinating discharge: Approximately 40 minutes  05/05/20, MD  [facility] 03/07/2020, 8:13 AM

## 2020-03-08 ENCOUNTER — Ambulatory Visit (HOSPITAL_COMMUNITY)
Admission: RE | Admit: 2020-03-08 | Discharge: 2020-03-08 | Disposition: A | Discharge status: Home / Self Care | Payer: BLUE CROSS/BLUE SHIELD | Source: Ambulatory Visit | Attending: Pulmonary Disease | Admitting: Pulmonary Disease

## 2020-03-08 DIAGNOSIS — J1282 Pneumonia due to coronavirus disease 2019: Secondary | ICD-10-CM | POA: Insufficient documentation

## 2020-03-08 DIAGNOSIS — U071 COVID-19: Secondary | ICD-10-CM | POA: Diagnosis not present

## 2020-03-08 MED ORDER — FAMOTIDINE IN NACL 20-0.9 MG/50ML-% IV SOLN: 20.0000 mg | Freq: Once | INTRAVENOUS | Status: DC | PRN

## 2020-03-08 MED ORDER — METHYLPREDNISOLONE SODIUM SUCC 125 MG IJ SOLR: 125.0000 mg | Freq: Once | INTRAMUSCULAR | Status: DC | PRN

## 2020-03-08 MED ORDER — ALBUTEROL SULFATE HFA 108 (90 BASE) MCG/ACT IN AERS: 2.0000 | INHALATION_SPRAY | Freq: Once | RESPIRATORY_TRACT | Status: DC | PRN

## 2020-03-08 MED ORDER — EPINEPHRINE 0.3 MG/0.3ML IJ SOAJ: 0.3000 mg | Freq: Once | INTRAMUSCULAR | Status: DC | PRN

## 2020-03-08 MED ORDER — DIPHENHYDRAMINE HCL 50 MG/ML IJ SOLN: 50.0000 mg | Freq: Once | INTRAMUSCULAR | Status: DC | PRN

## 2020-03-08 MED ORDER — SODIUM CHLORIDE 0.9 % IV SOLN: INTRAVENOUS | Status: DC | PRN

## 2020-03-08 NOTE — Progress Notes (Signed)
  Diagnosis: COVID-19  Physician: Dr. Delford Field  Procedure: [facility] Med: remdesivir infusion.  Complications: No immediate complications noted.  Discharge: Discharged home   Alexander Newman 03/08/2020

## 2020-03-09 ENCOUNTER — Ambulatory Visit (HOSPITAL_COMMUNITY)
Admit: 2020-03-09 | Discharge: 2020-03-09 | Disposition: A | Discharge status: Home / Self Care | Payer: BLUE CROSS/BLUE SHIELD | Attending: Pulmonary Disease | Admitting: Pulmonary Disease

## 2020-03-09 DIAGNOSIS — U071 COVID-19: Secondary | ICD-10-CM | POA: Diagnosis not present

## 2020-03-09 MED ORDER — SODIUM CHLORIDE 0.9 % IV SOLN: INTRAVENOUS | Status: DC | PRN

## 2020-03-09 MED ORDER — EPINEPHRINE 0.3 MG/0.3ML IJ SOAJ: 0.3000 mg | Freq: Once | INTRAMUSCULAR | Status: DC | PRN

## 2020-03-09 MED ORDER — SODIUM CHLORIDE 0.9 % IV SOLN
100.0000 mg | Freq: Once | INTRAVENOUS | Status: AC
  Administered 2020-03-09: 11:00:00 100 mg via INTRAVENOUS

## 2020-03-09 MED ORDER — DIPHENHYDRAMINE HCL 50 MG/ML IJ SOLN: 50.0000 mg | Freq: Once | INTRAMUSCULAR | Status: DC | PRN

## 2020-03-09 MED ORDER — METHYLPREDNISOLONE SODIUM SUCC 125 MG IJ SOLR: 125.0000 mg | Freq: Once | INTRAMUSCULAR | Status: DC | PRN

## 2020-03-09 MED ORDER — ALBUTEROL SULFATE HFA 108 (90 BASE) MCG/ACT IN AERS: 2.0000 | INHALATION_SPRAY | Freq: Once | RESPIRATORY_TRACT | Status: DC | PRN

## 2020-03-09 MED ORDER — FAMOTIDINE IN NACL 20-0.9 MG/50ML-% IV SOLN: 20.0000 mg | Freq: Once | INTRAVENOUS | Status: DC | PRN

## 2020-03-09 NOTE — Progress Notes (Signed)
  Diagnosis: COVID-19  Physician: Dr. Delford Field  Procedure: [facility] Infusion Clinic Med: remdesivir infusion.  Complications: No immediate complications noted.  Discharge: Discharged home   Alexander Newman 03/09/2020

## 2020-03-09 NOTE — Discharge Instructions (Signed)
10 Things You Can Do to Manage Your COVID-19 Symptoms at Home If you have possible or confirmed COVID-19: 1. Stay home from work and school. And stay away from other public places. If you must go out, avoid using any kind of public transportation, ridesharing, or taxis. 2. Monitor your symptoms carefully. If your symptoms get worse, call your healthcare provider immediately. 3. Get rest and stay hydrated. 4. If you have a medical appointment, call the healthcare provider ahead of time and tell them that you have or may have COVID-19. 5. For medical emergencies, call 911 and notify the dispatch personnel that you have or may have COVID-19. 6. Cover your cough and sneezes with a tissue or use the inside of your elbow. 7. Wash your hands often with soap and water for at least 20 seconds or clean your hands with an alcohol-based hand sanitizer that contains at least 60% alcohol. 8. As much as possible, stay in a specific room and away from other people in your home. Also, you should use a separate bathroom, if available. If you need to be around other people in or outside of the home, wear a mask. 9. Avoid sharing personal items with other people in your household, like dishes, towels, and bedding. 10. Clean all surfaces that are touched often, like counters, tabletops, and doorknobs. Use household cleaning sprays or wipes according to the label instructions. cdc.gov/coronavirus 06/04/2019 This information is not intended to replace advice given to you by your health care provider. Make sure you discuss any questions you have with your health care provider. Document Revised: 11/06/2019 Document Reviewed: 11/06/2019 Elsevier Patient Education  2020 Elsevier Inc.  

## 2021-07-24 ENCOUNTER — Encounter: Payer: Self-pay | Admitting: Physician Assistant

## 2021-07-24 ENCOUNTER — Emergency Department
Admission: EM | Admit: 2021-07-24 | Discharge: 2021-07-24 | Disposition: A | Discharge status: Home / Self Care | Payer: BLUE CROSS/BLUE SHIELD | Attending: Emergency Medicine | Admitting: Emergency Medicine

## 2021-07-24 ENCOUNTER — Other Ambulatory Visit: Payer: Self-pay

## 2021-07-24 ENCOUNTER — Emergency Department: Payer: BLUE CROSS/BLUE SHIELD

## 2021-07-24 DIAGNOSIS — Z8616 Personal history of COVID-19: Secondary | ICD-10-CM | POA: Insufficient documentation

## 2021-07-24 DIAGNOSIS — Z87891 Personal history of nicotine dependence: Secondary | ICD-10-CM | POA: Insufficient documentation

## 2021-07-24 DIAGNOSIS — U071 COVID-19: Secondary | ICD-10-CM | POA: Diagnosis not present

## 2021-07-24 DIAGNOSIS — J1282 Pneumonia due to coronavirus disease 2019: Secondary | ICD-10-CM | POA: Insufficient documentation

## 2021-07-24 DIAGNOSIS — R0602 Shortness of breath: Secondary | ICD-10-CM | POA: Diagnosis present

## 2021-07-24 DIAGNOSIS — J189 Pneumonia, unspecified organism: Secondary | ICD-10-CM

## 2021-07-24 MED ORDER — AZITHROMYCIN 500 MG PO TABS
500.0000 mg | ORAL_TABLET | Freq: Once | ORAL | Status: AC
  Administered 2021-07-24: 500 mg via ORAL
  Filled 2021-07-24: qty 1

## 2021-07-24 MED ORDER — ACETAMINOPHEN 325 MG PO TABS: 650.0000 mg | ORAL_TABLET | Freq: Once | ORAL | Status: DC

## 2021-07-24 MED ORDER — ALBUTEROL SULFATE HFA 108 (90 BASE) MCG/ACT IN AERS: 2.0000 | INHALATION_SPRAY | Freq: Four times a day (QID) | RESPIRATORY_TRACT | 0 refills | Status: AC | PRN

## 2021-07-24 MED ORDER — ACETAMINOPHEN 325 MG PO TABS
650.0000 mg | ORAL_TABLET | Freq: Once | ORAL | Status: AC
  Administered 2021-07-24: 650 mg via ORAL
  Filled 2021-07-24: qty 2

## 2021-07-24 MED ORDER — AMOXICILLIN-POT CLAVULANATE 875-125 MG PO TABS
1.0000 | ORAL_TABLET | Freq: Once | ORAL | Status: AC
  Administered 2021-07-24: 1 via ORAL
  Filled 2021-07-24: qty 1

## 2021-07-24 MED ORDER — AZITHROMYCIN 250 MG PO TABS: 250.0000 mg | ORAL_TABLET | Freq: Every day | ORAL | 0 refills | Status: AC

## 2021-07-24 MED ORDER — AMOXICILLIN-POT CLAVULANATE 875-125 MG PO TABS: 1.0000 | ORAL_TABLET | Freq: Two times a day (BID) | ORAL | 0 refills | Status: AC

## 2021-07-24 NOTE — Discharge Instructions (Addendum)
Take the prescription meds as prescribed.  Use inhaler as needed.  Follow-up with your primary provider or return to the ED for worsening symptoms as discussed.

## 2021-07-24 NOTE — ED Triage Notes (Signed)
Patient reports having positive home COVID test today and later in the day felt like he was breathing heavier than usual.

## 2021-07-24 NOTE — ED Provider Notes (Signed)
[facility] Emergency Department Provider Note ____________________________________________  Time seen: 2023  I have reviewed the triage vital signs and the nursing notes.  HISTORY  Chief Complaint  Shortness of Breath   HPI Alexander Newman is a 45 y.o. male with the below medical history, presents himself to the ED for evaluation of shortness of breath due to COVID-19.  Patient had a positive home test today, prior to beginning to feel like he was breathing heavier than normal.  He presents febrile, but in no acute distress.   History reviewed. No pertinent past medical history.  Patient Active Problem List   Diagnosis Date Noted   Pneumonia due to COVID-19 virus 03/05/2020   Obesity (BMI 35.0-39.9 without comorbidity) 03/05/2020   LFT elevation 03/05/2020   Hypokalemia 03/05/2020    History reviewed. No pertinent surgical history.  Prior to Admission medications   Medication Sig Start Date End Date Taking? Authorizing Provider  albuterol (VENTOLIN HFA) 108 (90 Base) MCG/ACT inhaler Inhale 2 puffs into the lungs every 6 (six) hours as needed for shortness of breath. 07/24/21  Yes Haeli Gerlich, Charlesetta Ivory, PA-C  amoxicillin-clavulanate (AUGMENTIN) 875-125 MG tablet Take 1 tablet by mouth 2 (two) times daily for 10 days. 07/24/21 08/03/21 Yes Liese Dizdarevic, Charlesetta Ivory, PA-C  azithromycin (ZITHROMAX Z-PAK) 250 MG tablet Take 1 tablet (250 mg total) by mouth daily for 4 days. Take 2 tablets (500 mg) on  Day 1,  followed by 1 tablet (250 mg) once daily on Days 2 through 5. 07/25/21 07/29/21 Yes Shellia Hartl, Charlesetta Ivory, PA-C    Allergies Patient has no known allergies.  History reviewed. No pertinent family history.  Social History Social History   Tobacco Use   Smoking status: Former   Smokeless tobacco: Never  Substance Use Topics   Alcohol use: Never   Drug use: Never    Review of Systems  Constitutional: Positive for fever. Eyes: Negative for visual  changes. ENT: Negative for sore throat. Cardiovascular: Negative for chest pain. Respiratory: Positive for shortness of breath. Gastrointestinal: Negative for abdominal pain, vomiting and diarrhea. Genitourinary: Negative for dysuria. Musculoskeletal: Negative for back pain. Skin: Negative for rash. Neurological: Negative for headaches, focal weakness or numbness. ____________________________________________  PHYSICAL EXAM:  VITAL SIGNS: ED Triage Vitals  Enc Vitals Group     BP 07/24/21 1928 127/73     Pulse Rate 07/24/21 1928 (!) 103     Resp 07/24/21 1928 20     Temp 07/24/21 1928 (!) 101.2 F (38.4 C)     Temp Source 07/24/21 1928 Oral     SpO2 07/24/21 1928 98 %     Weight 07/24/21 1926 280 lb (127 kg)     Height 07/24/21 1926 6\' 1"  (1.854 m)     Head Circumference --      Peak Flow --      Pain Score --      Pain Loc --      Pain Edu? --      Excl. in GC? --     Constitutional: Alert and oriented. Well appearing and in no distress. Head: Normocephalic and atraumatic. Eyes: Conjunctivae are normal. Normal extraocular movements Cardiovascular: Normal rate, regular rhythm. Normal distal pulses. Respiratory: Normal respiratory effort. No wheezes/rales/rhonchi. Gastrointestinal: Soft and nontender. No distention. Musculoskeletal: Nontender with normal range of motion in all extremities.  Neurologic:  Normal gait without ataxia. Normal speech and language. No gross focal neurologic deficits are appreciated. Skin:  Skin is warm, dry  and intact. No rash noted. Psychiatric: Mood and affect are normal. Patient exhibits appropriate insight and judgment. ____________________________________________    {LABS (pertinent positives/negatives)  Labs Reviewed - No data to display   ____________________________________________  {EKG  ____________________________________________   RADIOLOGY Official radiology report(s): DG Chest 2 View  Result Date: 07/24/2021 CLINICAL  DATA:  COVID positive.  Shortness of breath. EXAM: CHEST - 2 VIEW COMPARISON:  03/05/2020 FINDINGS: Shallow inspiration. Suggestion of patchy peribronchial infiltrates possibly indicating early multifocal pneumonia. Appearance is less severe than on the previous study. No pleural effusions. No pneumothorax. Heart size and pulmonary vascularity are normal. IMPRESSION: Possible early peribronchial infiltrates. Electronically Signed   By: Burman Nieves M.D.   On: 07/24/2021 20:52   ____________________________________________  PROCEDURES  Tylenol 650 mg PO Azithromycin 500 mg PO Augmentin 875 mg PO   Procedures ____________________________________________   INITIAL IMPRESSION / ASSESSMENT AND PLAN / ED COURSE  As part of my medical decision making, I reviewed the following data within the electronic MEDICAL RECORD NUMBER Radiograph reviewed as noted and Notes from prior ED visits     DDX: CAP, bronchitis, Covid   Patient with ED evaluation of cough, congestion, shortness of breath, and fever, with a positive home COVID test today.  Presents the ED for further evaluation of his shortness of breath.  Patient was evaluated in the ED, found to have peribronchiolar infiltrates concerning for early infectious process.  Patient will be treated empirically with azithromycin and Augmentin for community-acquired pneumonia and COVID infection.  He will follow-up with primary provider for ongoing symptoms.  Patient is stable for outpatient management, but is given strict return precautions.  Alexander Newman was evaluated in Emergency Department on 07/24/2021 for the symptoms described in the history of present illness. He was evaluated in the context of the global COVID-19 pandemic, which necessitated consideration that the patient might be at risk for infection with the SARS-CoV-2 virus that causes COVID-19. Institutional protocols and algorithms that pertain to the evaluation of patients at risk for COVID-19  are in a state of rapid change based on information released by regulatory bodies including the CDC and federal and state organizations. These policies and algorithms were followed during the patient's care in the ED. ____________________________________________  FINAL CLINICAL IMPRESSION(S) / ED DIAGNOSES  Final diagnoses:  COVID-19  Community acquired pneumonia, unspecified laterality      Karmen Stabs, Charlesetta Ivory, PA-C 07/24/21 2306    Merwyn Katos, MD 07/31/21 [phone]

## 2021-12-24 IMAGING — CR DG CHEST 2V
2 series · 2 of 2 positions shown · non-contrast
Comparison: 03/05/2020

CLINICAL DATA: COVID positive.  Shortness of breath.

EXAM:
CHEST - 2 VIEW

[chest pa]
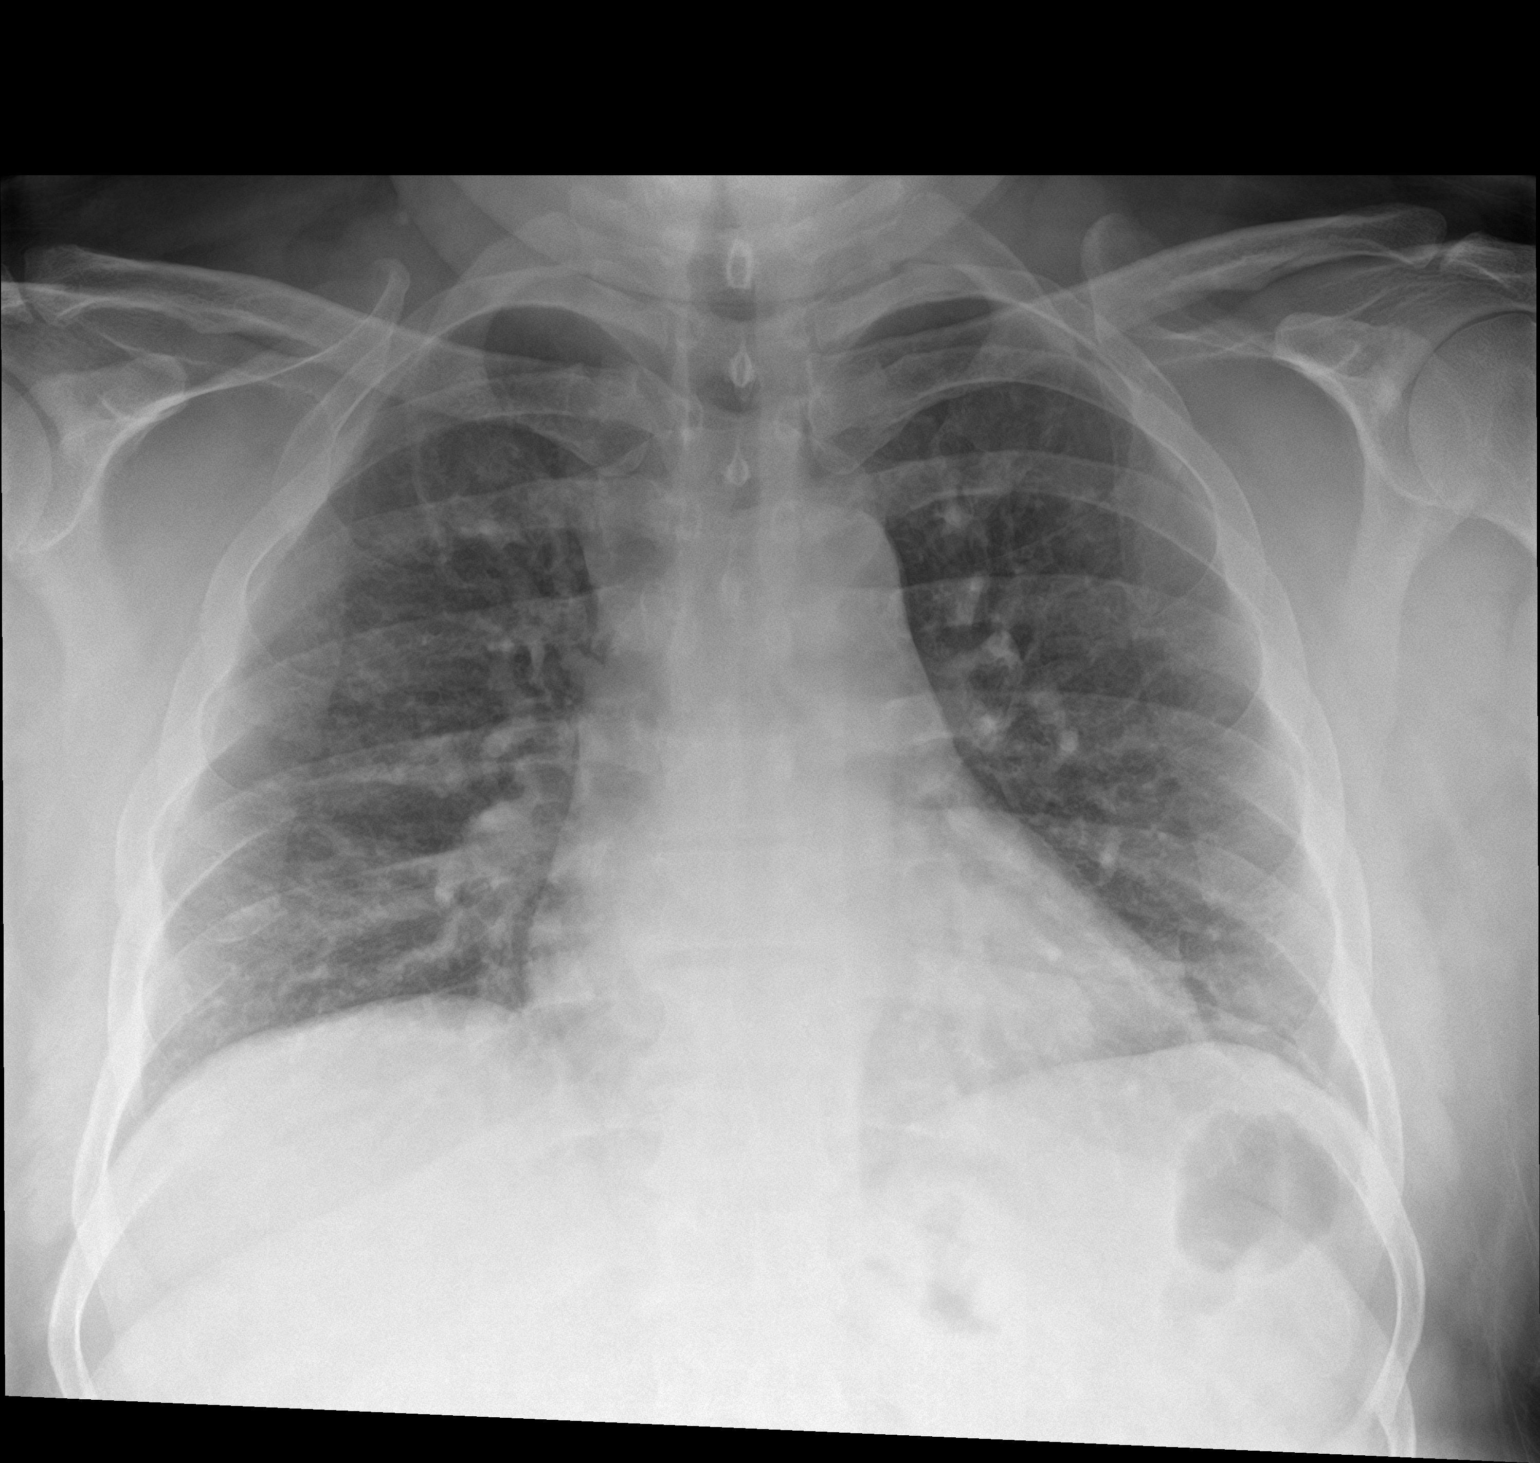

[chest lat]
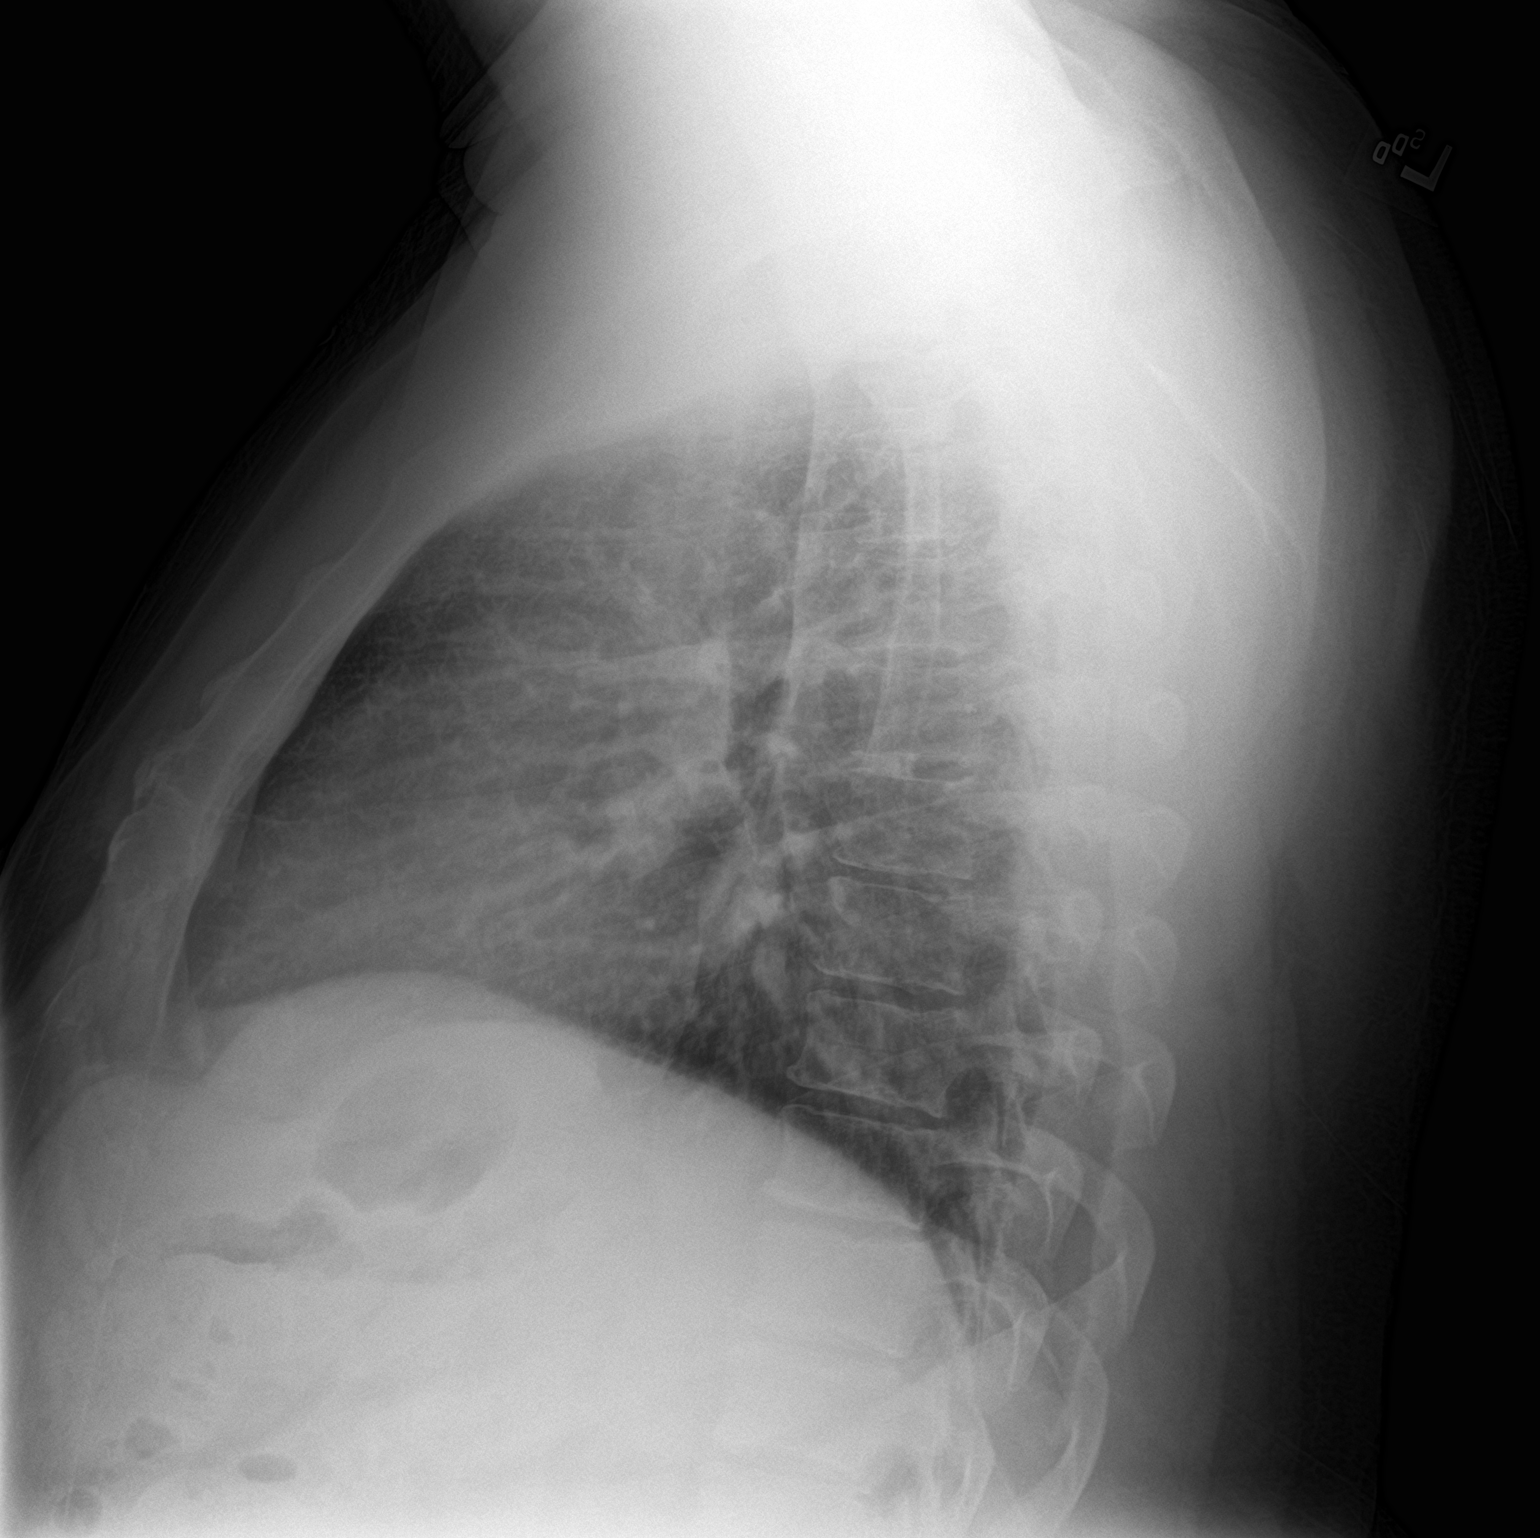

[2 of 2 positions shown; findings below may reference images not displayed]

FINDINGS: Shallow inspiration. Suggestion of patchy peribronchial infiltrates
possibly indicating early multifocal pneumonia. Appearance is less
severe than on the previous study. No pleural effusions. No
pneumothorax. Heart size and pulmonary vascularity are normal.
IMPRESSION: Possible early peribronchial infiltrates.

## 2024-11-20 ENCOUNTER — Other Ambulatory Visit: Payer: Self-pay | Admitting: Orthopedic Surgery

## 2024-11-20 DIAGNOSIS — M545 Low back pain, unspecified: Secondary | ICD-10-CM

## 2024-11-25 ENCOUNTER — Ambulatory Visit
Admission: RE | Admit: 2024-11-25 | Discharge: 2024-11-25 | Disposition: A | Discharge status: Home / Self Care | Payer: Worker's Compensation | Source: Ambulatory Visit | Attending: Orthopedic Surgery | Admitting: Orthopedic Surgery

## 2024-11-25 DIAGNOSIS — M545 Low back pain, unspecified: Secondary | ICD-10-CM
